# Patient Record
Sex: Male | Born: 1955 | Race: Black or African American | Hispanic: No | Marital: Single | Smoking: Never smoker
Health system: Southern US, Community
[De-identification: ages and names within clinical notes are randomized; demographics above are authoritative.]

## PROBLEM LIST (undated history)

## (undated) DIAGNOSIS — E119 Type 2 diabetes mellitus without complications: Secondary | ICD-10-CM

## (undated) DIAGNOSIS — I10 Essential (primary) hypertension: Secondary | ICD-10-CM

## (undated) DIAGNOSIS — H9190 Unspecified hearing loss, unspecified ear: Secondary | ICD-10-CM

## (undated) HISTORY — PX: JOINT REPLACEMENT: SHX530

---

## 2013-06-23 ENCOUNTER — Emergency Department (HOSPITAL_COMMUNITY): Payer: Medicare PPO

## 2013-06-23 ENCOUNTER — Encounter (HOSPITAL_COMMUNITY): Payer: Self-pay | Admitting: Emergency Medicine

## 2013-06-23 ENCOUNTER — Emergency Department (HOSPITAL_COMMUNITY)
Admission: EM | Admit: 2013-06-23 | Discharge: 2013-06-23 | Disposition: A | Discharge status: Home / Self Care | Payer: Medicare PPO | Attending: Emergency Medicine | Admitting: Emergency Medicine

## 2013-06-23 DIAGNOSIS — I10 Essential (primary) hypertension: Secondary | ICD-10-CM | POA: Insufficient documentation

## 2013-06-23 DIAGNOSIS — R1012 Left upper quadrant pain: Secondary | ICD-10-CM | POA: Insufficient documentation

## 2013-06-23 DIAGNOSIS — R1013 Epigastric pain: Secondary | ICD-10-CM | POA: Insufficient documentation

## 2013-06-23 DIAGNOSIS — Z794 Long term (current) use of insulin: Secondary | ICD-10-CM | POA: Insufficient documentation

## 2013-06-23 DIAGNOSIS — Z7982 Long term (current) use of aspirin: Secondary | ICD-10-CM | POA: Insufficient documentation

## 2013-06-23 DIAGNOSIS — H9190 Unspecified hearing loss, unspecified ear: Secondary | ICD-10-CM | POA: Insufficient documentation

## 2013-06-23 DIAGNOSIS — E119 Type 2 diabetes mellitus without complications: Secondary | ICD-10-CM | POA: Insufficient documentation

## 2013-06-23 DIAGNOSIS — R109 Unspecified abdominal pain: Secondary | ICD-10-CM

## 2013-06-23 DIAGNOSIS — Z79899 Other long term (current) drug therapy: Secondary | ICD-10-CM | POA: Insufficient documentation

## 2013-06-23 DIAGNOSIS — IMO0002 Reserved for concepts with insufficient information to code with codable children: Secondary | ICD-10-CM | POA: Insufficient documentation

## 2013-06-23 HISTORY — DX: Unspecified hearing loss, unspecified ear: H91.90

## 2013-06-23 HISTORY — DX: Essential (primary) hypertension: I10

## 2013-06-23 HISTORY — DX: Type 2 diabetes mellitus without complications: E11.9

## 2013-06-23 LAB — COMPREHENSIVE METABOLIC PANEL
ALBUMIN: 3.7 g/dL (ref 3.5–5.2)
ALT: 26 U/L (ref 0–53)
AST: 22 U/L (ref 0–37)
Alkaline Phosphatase: 64 U/L (ref 39–117)
BUN: 18 mg/dL (ref 6–23)
CALCIUM: 9.4 mg/dL (ref 8.4–10.5)
CO2: 29 mEq/L (ref 19–32)
CREATININE: 1.16 mg/dL (ref 0.50–1.35)
Chloride: 103 mEq/L (ref 96–112)
GFR calc Af Amer: 79 mL/min — ABNORMAL LOW (ref >90)
GFR calc non Af Amer: 68 mL/min — ABNORMAL LOW (ref >90)
Glucose, Bld: 72 mg/dL (ref 70–99)
Potassium: 4.3 mEq/L (ref 3.7–5.3)
Sodium: 142 mEq/L (ref 137–147)
Total Bilirubin: 0.4 mg/dL (ref 0.3–1.2)
Total Protein: 7.6 g/dL (ref 6.0–8.3)

## 2013-06-23 LAB — CBC WITH DIFFERENTIAL/PLATELET
BASOS ABS: 0 10*3/uL (ref 0.0–0.1)
Basophils Relative: 0 % (ref 0–1)
EOS ABS: 0.2 10*3/uL (ref 0.0–0.7)
EOS PCT: 3 % (ref 0–5)
HCT: 42.3 % (ref 39.0–52.0)
Hemoglobin: 13.7 g/dL (ref 13.0–17.0)
LYMPHS PCT: 31 % (ref 12–46)
Lymphs Abs: 1.8 10*3/uL (ref 0.7–4.0)
MCH: 29 pg (ref 26.0–34.0)
MCHC: 32.4 g/dL (ref 30.0–36.0)
MCV: 89.4 fL (ref 78.0–100.0)
Monocytes Absolute: 0.7 10*3/uL (ref 0.1–1.0)
Monocytes Relative: 11 % (ref 3–12)
Neutro Abs: 3.2 10*3/uL (ref 1.7–7.7)
Neutrophils Relative %: 55 % (ref 43–77)
PLATELETS: 327 10*3/uL (ref 150–400)
RBC: 4.73 MIL/uL (ref 4.22–5.81)
RDW: 13.3 % (ref 11.5–15.5)
WBC: 5.8 10*3/uL (ref 4.0–10.5)

## 2013-06-23 LAB — URINALYSIS, ROUTINE W REFLEX MICROSCOPIC
BILIRUBIN URINE: NEGATIVE
Glucose, UA: NEGATIVE mg/dL
KETONES UR: NEGATIVE mg/dL
LEUKOCYTES UA: NEGATIVE
NITRITE: NEGATIVE
Protein, ur: NEGATIVE mg/dL
Specific Gravity, Urine: 1.02 (ref 1.005–1.030)
Urobilinogen, UA: 0.2 mg/dL (ref 0.0–1.0)
pH: 7.5 (ref 5.0–8.0)

## 2013-06-23 LAB — URINE MICROSCOPIC-ADD ON

## 2013-06-23 LAB — LIPASE, BLOOD: Lipase: 27 U/L (ref 11–59)

## 2013-06-23 LAB — LACTIC ACID, PLASMA: LACTIC ACID, VENOUS: 1.5 mmol/L (ref 0.5–2.2)

## 2013-06-23 LAB — TROPONIN I

## 2013-06-23 MED ORDER — ONDANSETRON HCL 4 MG/2ML IJ SOLN
4.0000 mg | INTRAMUSCULAR | Status: DC | PRN
Start: 2013-06-23 — End: 2013-06-23
  Administered 2013-06-23: 4 mg via INTRAVENOUS
  Filled 2013-06-23: qty 2

## 2013-06-23 MED ORDER — IOHEXOL 300 MG/ML  SOLN
100.0000 mL | Freq: Once | INTRAMUSCULAR | Status: AC | PRN
  Administered 2013-06-23: 100 mL via INTRAVENOUS

## 2013-06-23 MED ORDER — IOHEXOL 300 MG/ML  SOLN
50.0000 mL | Freq: Once | INTRAMUSCULAR | Status: AC | PRN
  Administered 2013-06-23: 50 mL via ORAL

## 2013-06-23 MED ORDER — HYDROCODONE-ACETAMINOPHEN 5-325 MG PO TABS: ORAL_TABLET | ORAL | Status: AC

## 2013-06-23 MED ORDER — SODIUM CHLORIDE 0.9 % IV SOLN
INTRAVENOUS | Status: DC
  Administered 2013-06-23: 13:00:00 via INTRAVENOUS

## 2013-06-23 MED ORDER — MORPHINE SULFATE 4 MG/ML IJ SOLN
4.0000 mg | INTRAMUSCULAR | Status: DC | PRN
  Administered 2013-06-23: 4 mg via INTRAVENOUS
  Filled 2013-06-23: qty 1

## 2013-06-23 MED ORDER — FAMOTIDINE IN NACL 20-0.9 MG/50ML-% IV SOLN
20.0000 mg | Freq: Once | INTRAVENOUS | Status: AC
  Administered 2013-06-23: 20 mg via INTRAVENOUS
  Filled 2013-06-23: qty 50

## 2013-06-23 NOTE — Discharge Instructions (Signed)
°Emergency Department Resource Guide °1) Find a Doctor and Pay Out of Pocket °Although you won't have to find out who is covered by your insurance plan, it is a good idea to ask around and get recommendations. You will then need to call the office and see if the doctor you have chosen will accept you as a new patient and what types of options they offer for patients who are self-pay. Some doctors offer discounts or will set up payment plans for their patients who do not have insurance, but you will need to ask so you aren't surprised when you get to your appointment. ° °2) Contact Your Local Health Department °Not all health departments have doctors that can see patients for sick visits, but many do, so it is worth a call to see if yours does. If you don't know where your local health department is, you can check in your phone book. The CDC also has a tool to help you locate your state's health department, and many state websites also have listings of all of their local health departments. ° °3) Find a Walk-in Clinic °If your illness is not likely to be very severe or complicated, you may want to try a walk in clinic. These are popping up all over the country in pharmacies, drugstores, and shopping centers. They're usually staffed by nurse practitioners or physician assistants that have been trained to treat common illnesses and complaints. They're usually fairly quick and inexpensive. However, if you have serious medical issues or chronic medical problems, these are probably not your best option. ° °No Primary Care Doctor: °- Call [facility] at  832-8000 - they can help you locate a primary care doctor that  accepts your insurance, provides certain services, etc. °- Physician Referral Service- 1-800-533-3463 ° °Chronic Pain Problems: °Organization         Address  Phone   Notes  °[facility]  (336) 297-2271 Patients need to be referred by their primary care doctor.  ° °Medication  Assistance: °Organization         Address  Phone   Notes  °[place] [facility] [address] °[place], [place] [address] (336) 641-8030 --Must be a resident of [place] °-- Must have NO insurance coverage whatsoever (no Medicaid/ Medicare, etc.) °-- The pt. MUST have a primary care doctor that directs their care regularly and follows them in the community °  °[facility]  (866) 331-1348   °[employer]  (888) 892-1162   ° °Agencies that provide inexpensive medical care: °Organization         Address  Phone   Notes  °[facility]  (336) 832-8035   °[facility]    (336) 832-7272   °[facility] [address] °[place], [place] [address] (336) 832-4777   °[facility] [address], °[place] (336) 271-4999   °[facility]    (336) 373-0678   °[facility]    (336) 272-1050   °[facility] ° [address], [place] Phone:  (336) 832-4444, Fax:  (336) 832-4440 Hours of Operation:  9 am - 6 pm, M-F.  Also accepts Medicaid/Medicare and self-pay.  °[facility] ° [address], [place] Phone: (336) 832-3150, Fax: (336) 832-3151. Hours of Operation:  8:30 am - 5:30 pm, M-F.  Also accepts Medicaid and self-pay.  °[facility] [place] [address], [place] Phone: (336) 878-6027   °[facility] [address], [place], [place] (336)723-1848, Ext. 123 Mondays & Thursdays: 7-9 AM.  First 15 patients are seen on a first come, first serve basis. °  ° °Medicaid-accepting [place] Providers: ° °Organization         Address  Phone   Notes  °[facility] [address], [place] (336) 641-2100 Also accepts self-pay patients.  °[facility] [address], [place] ° (336) 856-9996   °[facility] [address], [place]  (336) 288-8857   °[facility] [address] [place] [address], [place] (336) 299-7000   °Veita Bland [address], [place]  ° (336) 373-1557 Only accepts [place] Access Medicaid patients after they have their name applied to their card.  ° °Self-Pay (no insurance) in [place]: ° °Organization         Address  Phone   Notes  °Sickle Cell Patients, [facility] [address], [place] (336) 832-1970   °[facility] [address], [place] (336) 832-4400   °[facility] ° [address], [place] (336) 992-4800   °[facility]/Dr. Osei-Bonsu ° [address] [place] [address], [place] or [address], [place] (336) 841-8500 Phone number for both [place] and [place] locations is the same.  °[facility] [address], [place] (336) 299-0000   °[facility] [address] [place] [address], [place] or [address] (336) 852-7530 °(336) 878-2260   °[facility] [address], [place] (336) 350-1642, phone; (336) 294-5005, fax Sees patients 1st and 3rd Saturday of every month.  Must not qualify for public or private insurance (i.e. Medicaid, Medicare, [place] Health Choice, Veterans' Benefits) • Household income should be no more than 200% of the poverty level •The clinic cannot treat you if you are pregnant or think you are pregnant • Sexually transmitted diseases are not treated at the clinic.  ° ° °Dental Care: °Organization         Address  Phone  Notes  °[place] [facility] [address], [place] (336) 641-6152 Accepts children up to age 21 who are enrolled in Medicaid or [place] Health Choice; pregnant women with a Medicaid card; and children who have applied for Medicaid or [place] Health Choice, but were declined, whose parents can pay a reduced fee at time of service.  °[place]  [facility] [place]  [address], [place] (336) 641-7733 Accepts children up to age 21 who are enrolled in Medicaid or [place] Health Choice; pregnant women with a Medicaid card; and children who have applied for Medicaid or [place] Health Choice, but were declined, whose parents can pay a reduced fee at time of service.  °[facility] ° [address], [place] (336) 641-4533 Patients are seen by appointment only. Walk-ins are not accepted. [facility] will see patients 18 years of age and older. °Monday - Tuesday (8am-5pm) °Most Wednesdays (8:30-5pm) °$30 per visit, cash only  °[facility] ° [address], [place] (336) 641-4533 Patients are seen by appointment only. Walk-ins are not accepted. [facility] will see patients 18 years of age and older. °One   Wednesday Evening (Monthly: Volunteer Based).  $30 per visit, cash only  °[facility]  (919) 537-3737 for adults; Children under age 4, call Graduate Pediatric Dentistry at ([phone]) [phone]. Children aged 4-14, please call ([phone]) [phone] to request a pediatric application. ° Dental services are provided in all areas of dental care including fillings, crowns and bridges, complete and partial dentures, implants, gum treatment, root canals, and extractions. Preventive care is also provided. Treatment is provided to both adults and children. °Patients are selected via a lottery and there is often a waiting list. °  °[facility] [address], °[place] ° (336) 763-8833 [email or web address] °  °[facility] [address], [place], [place] (336)723-1848, Ext. 123 Second and Fourth Thursday of each month, opens at 6:30 AM; Clinic ends at 9 AM.  Patients are seen on a first-come first-served basis, and a limited number are seen during each clinic.  ° °[facility] ° [address], [place], [place] (336) 723-7904    Eligibility Requirements °You must have lived in [place], [place], or [place] counties for at least the last three months. °  You cannot be eligible for state or federal sponsored healthcare insurance, including Veterans Administration, Medicaid, or Medicare. °  You generally cannot be eligible for healthcare insurance through your employer.  °  How to apply: °Eligibility screenings are held every Tuesday and Wednesday afternoon from 1:00 pm until 4:00 pm. You do not need an appointment for the interview!  °[facility] [address], [place], [place] 336-631-2330   °[facility]  336-342-8273   °[facility]  336-703-3100   °[facility]  336-570-6415   ° °Behavioral Health Resources in the Community: °Intensive Outpatient Programs °Organization         Address  Phone  Notes  °[facility] [address], [place], [place] 336-878-6098   °[facility] [address], [place], [place] 336-832-9800   °[facility] [address], [place], [place] ° 336-882-2125   °[facility] [address],  °[place], [place] 1-800-853-5163 or 336-641-4981   °Substance Abuse Resources °Organization         Address  Phone  Notes  °[facility]  336-882-2125   °[facility]  336-784-9470   °[facility]  336-285-9073   °[facility]  336-845-3988   °Residential & Outpatient Substance Abuse Program  1-800-659-3381   °Psychological Services °Organization         Address  Phone  Notes  °[facility]  336- 832-9600   °[facility]  336- 378-7881   °[facility] [address], [place] 1-800-853-5163 or 336-641-4981   ° °Mobile Crisis Teams °Organization         Address  Phone  Notes  °[facility], Mobile Crisis Care Unit  1-877-626-1772   °Assertive °[facility] ° [address]  [place], [place] 336-834-9664   °Sharon DeEsch [address] °[place] 336-554-5454   ° °Self-Help/Support Groups °Organization         Address  Phone             Notes  °[facility]  - variety of support groups  336- 373-1402 Call for more information  °Narcotics Anonymous (NA), [facility] [address], °[place]  2 meetings at this location  ° °  Residential Treatment Programs Organization         Address  Phone  Notes  [facility] [address],    [place]  [phone]   [facility]  [address], [place] [phone], [place], [place] [phone]   [facility] [address], [place] [phone] Admissions: 8am-3pm M-F  [facility] [address]    [place], [place] 295-284-1324([phone])[phone]   [facility] [address], [place], [place] [phone]   [facility] [address]  [place], [place] [phone])[phone]   [facility] Intensive Outpatient [address], [place], [place] [phone]   [facility] [address]  [place], [place] [phone]-(214)[phone] or [phone]   [facility] [address] [place], [place] [phone] Accepts Medicaid  [facility] [address]  [place] [phone] Substance Abuse/Addiction Treatment   [facility] Organization         Address  Phone  Notes  [facility]  [phone](888) (267)[phone]   Angie FavaJulie Brannon, PhD [address], [address] [place], [place]   [phone](336) [phone] or [phone](336) [phone]   [facility]   [address] [place], [place] [phone]) [phone]   [facility] [address], [place], [place] [phone](336) ([phone])[phone] Insurance/Medicaid/sponsorship through [facility] [address]                                    [place], [place] [phone](336) ([phone] Therapy/tele-psych/case    [facility] [address].   [place], [place] [phone](336) [phone]    Dr. Lolly MustacheArfeen  [phone]) [phone]   [facility]  [employer] [facility] 1) [address], [place] 2) [address], [place] 3)  [address], [place] [phone](336) [phone](336) ([phone]) [phone]  [phone](336) ([phone]   [facility] Child [facility] Hot[facility] 616-254-9675(336) (952) 130-7496 or 223-830-2300(336) [phone] (After Hours)       Eat a bland diet, avoiding greasy, fatty, fried foods, as well as spicy and acidic foods or beverages.  Avoid eating within the hour or 2 before going to bed or laying down.  Also avoid teas, colas, coffee, chocolate, pepermint and spearment.  Take over the counter pepcid, one tablet by mouth twice a day, for the next 2 to 3 weeks.  May also take over the counter maalox/mylanta, as directed on packaging, as needed for discomfort.  Take your usual prescriptions as previously directed.  Call your regular medical doctor today to schedule a follow up appointment in the next 2 days. Call the GI doctor today to schedule a follow up appointment within the next week.  Return to the Emergency Department immediately if worsening.

## 2013-06-23 NOTE — ED Notes (Signed)
Abdominal pain times 2 days.  Denies any fever,  Vomiting or diarrhea.

## 2013-06-23 NOTE — ED Provider Notes (Signed)
CSN: [id number]     Arrival date & time 06/23/13  1145 History   First MD Initiated Contact with Patient 06/23/13 1221     Chief Complaint  Patient presents with  . Abdominal Pain     HPI Pt was seen at 1225.  Per pt, c/o gradual onset and persistence of constant abd "pain" for the past 2 days. Describes the abd pain as "sore." Has not taken any meds for same. Denies hx of same. Denies N/V/D, no fevers, no back pain, no rash, no CP/SOB, no black or blood in stools.       Past Medical History  Diagnosis Date  . Diabetes mellitus without complication   . Hypertension   . Hearing deficit    Past Surgical History  Procedure Laterality Date  . Joint replacement      History  Substance Use Topics  . Smoking status: Never Smoker   . Smokeless tobacco: Not on file  . Alcohol Use: No    Review of Systems ROS: Statement: All systems negative except as marked or noted in the HPI; Constitutional: Negative for fever and chills. ; ; Eyes: Negative for eye pain, redness and discharge. ; ; ENMT: Negative for ear pain, hoarseness, nasal congestion, sinus pressure and sore throat. ; ; Cardiovascular: Negative for chest pain, palpitations, diaphoresis, dyspnea and peripheral edema. ; ; Respiratory: Negative for cough, wheezing and stridor. ; ; Gastrointestinal: +abd pain. Negative for nausea, vomiting, diarrhea, blood in stool, hematemesis, jaundice and rectal bleeding. . ; ; Genitourinary: Negative for dysuria, flank pain and hematuria. ; ; Musculoskeletal: Negative for back pain and neck pain. Negative for swelling and trauma.; ; Skin: Negative for pruritus, rash, abrasions, blisters, bruising and skin lesion.; ; Neuro: Negative for headache, lightheadedness and neck stiffness. Negative for weakness, altered level of consciousness , altered mental status, extremity weakness, paresthesias, involuntary movement, seizure and syncope.      Allergies  Review of patient's allergies indicates no known  allergies.  Home Medications   Current Outpatient Rx  Name  Route  Sig  Dispense  Refill  . albuterol (PROVENTIL HFA;VENTOLIN HFA) 108 (90 BASE) MCG/ACT inhaler   Inhalation   Inhale 2 puffs into the lungs 4 (four) times daily.         Marland Kitchen amLODipine (NORVASC) 10 MG tablet   Oral   Take 10 mg by mouth daily.         Marland Kitchen aspirin EC 81 MG tablet   Oral   Take 81 mg by mouth daily.         Marland Kitchen atorvastatin (LIPITOR) 40 MG tablet   Oral   Take 40 mg by mouth at bedtime.         . Cholecalciferol (VITAMIN D) 2000 UNITS CAPS   Oral   Take 1 capsule by mouth 2 (two) times a week.         . Fluticasone-Salmeterol (ADVAIR) 250-50 MCG/DOSE AEPB   Inhalation   Inhale 1 puff into the lungs every 12 (twelve) hours.         . insulin detemir (LEVEMIR) 100 UNIT/ML injection   Subcutaneous   Inject 40 Units into the skin every morning.         . labetalol (NORMODYNE) 200 MG tablet   Oral   Take 200 mg by mouth 2 (two) times daily.         Marland Kitchen latanoprost (XALATAN) 0.005 % ophthalmic solution   Both Eyes   Place 1 drop  into both eyes at bedtime.         Marland Kitchen. loratadine (CLARITIN) 10 MG tablet   Oral   Take 10 mg by mouth daily.         Marland Kitchen. losartan-hydrochlorothiazide (HYZAAR) 100-25 MG per tablet   Oral   Take 1 tablet by mouth daily.         . metFORMIN (GLUCOPHAGE) 500 MG tablet   Oral   Take 500 mg by mouth 2 (two) times daily with a meal.          BP 160/87  Pulse 73  Temp(Src) 98.2 F (36.8 C) (Oral)  Resp 20  Ht 5\' 7"  (1.702 m)  Wt 250 lb (113.399 kg)  BMI 39.15 kg/m2  SpO2 96% Physical Exam 1230: Physical examination:  Nursing notes reviewed; Vital signs and O2 SAT reviewed;  Constitutional: Well developed, Well nourished, Well hydrated, In no acute distress; Head:  Normocephalic, atraumatic; Eyes: EOMI, PERRL, No scleral icterus; ENMT: Mouth and pharynx normal, Mucous membranes moist; Neck: Supple, Full range of motion, No lymphadenopathy;  Cardiovascular: Regular rate and rhythm, No gallop; Respiratory: Breath sounds clear & equal bilaterally, No rales, rhonchi, wheezes.  Speaking full sentences with ease, Normal respiratory effort/excursion; Chest: Nontender, Movement normal; Abdomen: Soft, +mild diffuse tenderness to palp, esp LUQ and mid-epigastric areas. No rebound or guarding. Nondistended, Normal bowel sounds; Genitourinary: No CVA tenderness; Extremities: Pulses normal, No tenderness, No edema, No calf edema or asymmetry.; Neuro: AA&Ox3, HOH per baseline, otherwise major CN grossly intact.  Speech clear. No gross focal motor or sensory deficits in extremities. Climbs on and off stretcher easily by himself. Gait steady.; Skin: Color normal, Warm, Dry.   ED Course  Procedures   EKG Interpretation    Date/Time:  Wednesday June 23 2013 12:46:21 EST Ventricular Rate:  68 PR Interval:  178 QRS Duration: 76 QT Interval:  394 QTC Calculation: 418 R Axis:   7 Text Interpretation:  Normal sinus rhythm Baseline wander Anteroseptal infarct , age undetermined Nonspecific T wave abnormality Abnormal ECG No previous ECGs available Confirmed by [facility]  MD, Nicholos JohnsKATHLEEN [phone]) on 06/23/2013 2:05:00 PM            MDM  MDM Reviewed: previous chart, nursing note and vitals Reviewed previous: labs Interpretation: labs, x-ray, CT scan and ECG    Results for orders placed during the hospital encounter of 06/23/13  URINALYSIS, ROUTINE W REFLEX MICROSCOPIC      Result Value Ref Range   Color, Urine YELLOW  YELLOW   APPearance CLEAR  CLEAR   Specific Gravity, Urine 1.020  1.005 - 1.030   pH 7.5  5.0 - 8.0   Glucose, UA NEGATIVE  NEGATIVE mg/dL   Hgb urine dipstick SMALL (*) NEGATIVE   Bilirubin Urine NEGATIVE  NEGATIVE   Ketones, ur NEGATIVE  NEGATIVE mg/dL   Protein, ur NEGATIVE  NEGATIVE mg/dL   Urobilinogen, UA 0.2  0.0 - 1.0 mg/dL   Nitrite NEGATIVE  NEGATIVE   Leukocytes, UA NEGATIVE  NEGATIVE  URINE MICROSCOPIC-ADD  ON      Result Value Ref Range   Squamous Epithelial / LPF FEW (*) RARE   WBC, UA 3-6  <3 WBC/hpf   RBC / HPF 3-6  <3 RBC/hpf   Bacteria, UA FEW (*) RARE  CBC WITH DIFFERENTIAL      Result Value Ref Range   WBC 5.8  4.0 - 10.5 K/uL   RBC 4.73  4.22 - 5.81 MIL/uL   Hemoglobin 13.7  13.0 - 17.0 g/dL   HCT 78.4  69.6 - 29.5 %   MCV 89.4  78.0 - 100.0 fL   MCH 29.0  26.0 - 34.0 pg   MCHC 32.4  30.0 - 36.0 g/dL   RDW 28.4  13.2 - 44.0 %   Platelets 327  150 - 400 K/uL   Neutrophils Relative % 55  43 - 77 %   Neutro Abs 3.2  1.7 - 7.7 K/uL   Lymphocytes Relative 31  12 - 46 %   Lymphs Abs 1.8  0.7 - 4.0 K/uL   Monocytes Relative 11  3 - 12 %   Monocytes Absolute 0.7  0.1 - 1.0 K/uL   Eosinophils Relative 3  0 - 5 %   Eosinophils Absolute 0.2  0.0 - 0.7 K/uL   Basophils Relative 0  0 - 1 %   Basophils Absolute 0.0  0.0 - 0.1 K/uL  COMPREHENSIVE METABOLIC PANEL      Result Value Ref Range   Sodium 142  137 - 147 mEq/L   Potassium 4.3  3.7 - 5.3 mEq/L   Chloride 103  96 - 112 mEq/L   CO2 29  19 - 32 mEq/L   Glucose, Bld 72  70 - 99 mg/dL   BUN 18  6 - 23 mg/dL   Creatinine, Ser 1.02  0.50 - 1.35 mg/dL   Calcium 9.4  8.4 - 72.5 mg/dL   Total Protein 7.6  6.0 - 8.3 g/dL   Albumin 3.7  3.5 - 5.2 g/dL   AST 22  0 - 37 U/L   ALT 26  0 - 53 U/L   Alkaline Phosphatase 64  39 - 117 U/L   Total Bilirubin 0.4  0.3 - 1.2 mg/dL   GFR calc non Af Amer 68 (*) >90 mL/min   GFR calc Af Amer 79 (*) >90 mL/min  LIPASE, BLOOD      Result Value Ref Range   Lipase 27  11 - 59 U/L  LACTIC ACID, PLASMA      Result Value Ref Range   Lactic Acid, Venous 1.5  0.5 - 2.2 mmol/L  TROPONIN I      Result Value Ref Range   Troponin I <0.30  <0.30 ng/mL   Dg Chest 2 View 06/23/2013   CLINICAL DATA:  Abdominal pain.  EXAM: CHEST  2 VIEW  COMPARISON:  None.  FINDINGS: Cardiomegaly. Upper mediastinal contours are distorted by the hypoaeration. The trachea is displaced to the right, likely tracheal buckling  from low volume lungs. No edema or consolidation. No effusion or pneumothorax.  IMPRESSION: Low volume lungs.  No edema or consolidation.   Electronically Signed   By: Tiburcio Pea M.D.   On: 06/23/2013 14:50   Ct Abdomen Pelvis W Contrast 06/23/2013   CLINICAL DATA:  Abdomen pain  EXAM: CT ABDOMEN AND PELVIS WITH CONTRAST  TECHNIQUE: Multidetector CT imaging of the abdomen and pelvis was performed using the standard protocol following bolus administration of intravenous contrast.  CONTRAST:  OMNIPAQUE IOHEXOL 300 MG/ML SOLN, 50mL OMNIPAQUE IOHEXOL 300 MG/ML SOLN  COMPARISON:  None.  FINDINGS: There is diffuse fatty infiltration of liver. The spleen, pancreas, gallbladder are normal. There are small low-density nodules in superior aspect bilateral adrenal glands, nonspecific on this postcontrast exam but statistically most likely adrenal adenoma. There is a 9 mm cyst in the upper pole right kidney. The kidneys are otherwise normal. There is no hydronephrosis bilaterally. The aorta is normal.  There is no abdominal lymphadenopathy. There is no small bowel obstruction or diverticulitis. The appendix is not seen but no inflammation is noted around cecum.  Partial fluid-filled bladder is normal. Prostate calcifications are identified. Minimal dependent atelectasis of the lung bases are noted. Degenerative joint changes of the spine are noted. No acute abnormalities identified within the visualized bones.  IMPRESSION: No acute abnormality identified in the abdomen and pelvis. Diffuse fatty infiltration of liver. Right kidney cyst.   Electronically Signed   By: Sherian Rein M.D.   On: 06/23/2013 14:20     1520:   Pt states he feels better after meds and wants to go home now. Pt has tol PO well without N/V. No stooling while in the ED. Abd soft/NT, VSS.  EKG with NS STTW changes, no old to compare. Doubt ACS as cause for symptoms today with normal troponin after 2 days of constant atypical symptoms, as well  as pt repetitively denying any episodes of CP/SOB today or at any time in the past several days. Dx and testing d/w pt and family.  Questions answered.  Verb understanding, agreeable to d/c home with outpt f/u.   Laray Anger, DO 06/26/13 1240

## 2013-06-24 LAB — URINE CULTURE

## 2017-07-19 ENCOUNTER — Emergency Department (HOSPITAL_COMMUNITY): Payer: Medicare PPO

## 2017-07-19 ENCOUNTER — Other Ambulatory Visit: Payer: Self-pay

## 2017-07-19 ENCOUNTER — Encounter (HOSPITAL_COMMUNITY): Payer: Self-pay | Admitting: Emergency Medicine

## 2017-07-19 ENCOUNTER — Emergency Department (HOSPITAL_COMMUNITY)
Admission: EM | Admit: 2017-07-19 | Discharge: 2017-07-20 | Disposition: A | Discharge status: Home / Self Care | Payer: Medicare PPO | Attending: Emergency Medicine | Admitting: Emergency Medicine

## 2017-07-19 DIAGNOSIS — R05 Cough: Secondary | ICD-10-CM | POA: Insufficient documentation

## 2017-07-19 DIAGNOSIS — R109 Unspecified abdominal pain: Secondary | ICD-10-CM | POA: Diagnosis present

## 2017-07-19 DIAGNOSIS — Z79899 Other long term (current) drug therapy: Secondary | ICD-10-CM | POA: Diagnosis not present

## 2017-07-19 DIAGNOSIS — Z7982 Long term (current) use of aspirin: Secondary | ICD-10-CM | POA: Diagnosis not present

## 2017-07-19 DIAGNOSIS — I10 Essential (primary) hypertension: Secondary | ICD-10-CM | POA: Diagnosis not present

## 2017-07-19 DIAGNOSIS — E119 Type 2 diabetes mellitus without complications: Secondary | ICD-10-CM | POA: Diagnosis not present

## 2017-07-19 DIAGNOSIS — R067 Sneezing: Secondary | ICD-10-CM | POA: Diagnosis not present

## 2017-07-19 DIAGNOSIS — Z794 Long term (current) use of insulin: Secondary | ICD-10-CM | POA: Insufficient documentation

## 2017-07-19 MED ORDER — MORPHINE SULFATE (PF) 4 MG/ML IV SOLN
4.0000 mg | Freq: Once | INTRAVENOUS | Status: AC
  Administered 2017-07-20: 4 mg via INTRAVENOUS
  Filled 2017-07-19: qty 1

## 2017-07-19 NOTE — ED Triage Notes (Addendum)
Patient reports L sided pain that is tender to touch, states he has had the pain for a while. [facility] with [facility]. Patient denies urinary symptoms or constipation. [facility]. Patient states he continues to have pain.

## 2017-07-19 NOTE — ED Provider Notes (Signed)
[facility] EMERGENCY DEPARTMENT Provider Note   CSN: [id number] Arrival date & time: 07/19/17  2123     History   Chief Complaint Chief Complaint  Patient presents with  . Abdominal Pain    HPI Henry NailDonald Frost is a 62 y.o. male.  The history is provided by the patient.  Abdominal Pain    He has history of hypertension and diabetes and comes in with left lateral abdominal pain for about the last week.  There has been associated sneezing and coughing.  Pain is worse when he sneezes or coughs.  It is also worse when he touches it.  Pain is rated at 7/10.  There has been no nausea or vomiting.  However, when he eats, his abdomen starts to feel very bloated and tight.  He has not taken anything for the pain.  He was [facility] and diagnosed with gastritis, advised to use over-the-counter proton pump inhibitors.  Also noted to be constipated and given a dose of magnesium citrate.  Symptoms have not improved in spite of this treatment.  Past Medical History:  Diagnosis Date  . Diabetes mellitus without complication (HCC)   . Hearing deficit   . Hypertension     There are no active problems to display for this patient.   Past Surgical History:  Procedure Laterality Date  . JOINT REPLACEMENT          Home Medications    Prior to Admission medications   Medication Sig Start Date End Date Taking? Authorizing Provider  albuterol (PROVENTIL HFA;VENTOLIN HFA) 108 (90 BASE) MCG/ACT inhaler Inhale 2 puffs into the lungs 4 (four) times daily.    [provider]  amLODipine (NORVASC) 10 MG tablet Take 10 mg by mouth daily.    [provider]  aspirin EC 81 MG tablet Take 81 mg by mouth daily.    [provider]  atorvastatin (LIPITOR) 40 MG tablet Take 40 mg by mouth at bedtime.    [provider]  Cholecalciferol (VITAMIN D) 2000 UNITS CAPS Take 1 capsule by mouth 2 (two) times a week.    [provider]  Fluticasone-Salmeterol (ADVAIR) 250-50 MCG/DOSE AEPB Inhale 1 puff into the lungs every 12 (twelve) hours.    [provider]  HYDROcodone-acetaminophen (NORCO/VICODIN) 5-325 MG per tablet 1 or 2 tabs PO q6 hours prn pain 06/23/13   Samuel JesterMcManus, Kathleen, DO  insulin detemir (LEVEMIR) 100 UNIT/ML injection Inject 40 Units into the skin every morning.    [provider]  labetalol (NORMODYNE) 200 MG tablet Take 200 mg by mouth 2 (two) times daily.    [provider]  latanoprost (XALATAN) 0.005 % ophthalmic solution Place 1 drop into both eyes at bedtime.    [provider]  loratadine (CLARITIN) 10 MG tablet Take 10 mg by mouth daily.    [provider]  losartan-hydrochlorothiazide (HYZAAR) 100-25 MG per tablet Take 1 tablet by mouth daily.    [provider]  metFORMIN (GLUCOPHAGE) 500 MG tablet Take 500 mg by mouth 2 (two) times daily with a meal.    [provider]    Family History Family History  Problem Relation Age of Onset  . Stroke Mother   . Heart disease Brother   . Hypertension Other   . Diabetes Other   . Cancer Other     Social History Social History   Tobacco Use  . Smoking status: Never Smoker  .  Smokeless tobacco: Never Used  Substance Use Topics  . Alcohol use: No  . Drug use: No     Allergies   Patient has no known allergies.   Review of Systems Review of Systems  Gastrointestinal: Positive for abdominal pain.  All other systems reviewed and are negative.    Physical Exam Updated Vital Signs BP (!) 145/85   Pulse 82   Temp 98.2 F (36.8 C) (Oral)   Resp 20   Ht 5\' 7"  (1.702 m)   Wt 110.2 kg (243 lb)   SpO2 97%   BMI 38.06 kg/m   Physical Exam  Nursing note and vitals reviewed.  Obese 63 year old male, resting comfortably and in no acute distress. Vital signs are significant for elevated systolic blood pressure. Oxygen saturation is 97%, which is normal. Head is  normocephalic and atraumatic. PERRLA, EOMI. Oropharynx is clear. Neck is nontender and supple without adenopathy or JVD. Back is nontender and there is no CVA tenderness. Lungs are clear without rales, wheezes, or rhonchi. Chest is nontender. Heart has regular rate and rhythm without murmur. Abdomen is soft, flat, with moderate tenderness over the left lateral lower rib cage and left lateral upper abdomen.  There is no rebound or guarding.  There are no masses or hepatosplenomegaly and peristalsis is normoactive. Extremities have no cyanosis or edema, full range of motion is present. Skin is warm and dry without rash. Neurologic: Mental status is normal, cranial nerves are intact, there are no motor or sensory deficits.  ED Treatments / Results  Labs (all labs ordered are listed, but only abnormal results are displayed) Labs Reviewed  COMPREHENSIVE METABOLIC PANEL - Abnormal; Notable for the following components:      Result Value   Glucose, Bld 102 (*)    BUN 23 (*)    ALT 12 (*)    All other components within normal limits  URINALYSIS, ROUTINE W REFLEX MICROSCOPIC - Abnormal; Notable for the following components:   Hgb urine dipstick SMALL (*)    Squamous Epithelial / LPF 0-5 (*)    All other components within normal limits  CBC WITH DIFFERENTIAL/PLATELET  LIPASE, BLOOD    Radiology Dg Chest 2 View  Result Date: 07/20/2017 CLINICAL DATA:  Cough. EXAM: CHEST - 2 VIEW COMPARISON:  06/23/2013 FINDINGS: Mild cardiomegaly and tortuous thoracic aorta, similar to prior. No pulmonary edema. No focal airspace disease, pleural effusion or pneumothorax. Chronic change about the right shoulder. IMPRESSION: Mild cardiomegaly without congestive failure or acute abnormality. Electronically Signed   By: Rubye Oaks M.D.   On: 07/20/2017 01:57   Ct Abdomen Pelvis W Contrast  Result Date: 07/20/2017 CLINICAL DATA:  Abdominal pain EXAM: CT ABDOMEN AND PELVIS WITH CONTRAST TECHNIQUE:  Multidetector CT imaging of the abdomen and pelvis was performed using the standard protocol following bolus administration of intravenous contrast. CONTRAST:  ISOVUE-300 IOPAMIDOL (ISOVUE-300) INJECTION 61% COMPARISON:  CT abdomen pelvis 06/23/2013 FINDINGS: Lower chest: No basilar pulmonary nodules or pleural effusion. No apical pericardial effusion. Hepatobiliary: Normal hepatic contours and density. No intra- or extrahepatic biliary dilatation. Normal gallbladder. Pancreas: Normal parenchymal contours without ductal dilatation. No peripancreatic fluid collection. Spleen: Normal. Adrenals/Urinary Tract: --Adrenal glands: Normal. --Right kidney/ureter: No hydronephrosis, perinephric stranding or nephrolithiasis. No obstructing ureteral stones. --Left kidney/ureter: No hydronephrosis, perinephric stranding or nephrolithiasis. No obstructing ureteral stones. --Urinary bladder: Normal appearance for the degree of distention. Stomach/Bowel: --Stomach/Duodenum: No hiatal hernia or other gastric abnormality. Normal duodenal course. --Small bowel: No dilatation or  inflammation. --Colon: There is distal transverse, descending and sigmoid colonic diverticulosis without acute inflammation. --Appendix: Not visualized. No right lower quadrant inflammation or free fluid. Vascular/Lymphatic: Normal course and caliber of the major abdominal vessels. No abdominal or pelvic lymphadenopathy. Reproductive: There are calcifications within the normal sized prostate gland. Musculoskeletal. Multilevel degenerative disc disease and facet arthrosis. No bony spinal canal stenosis. Other: There is edema of the subcutaneous fat of the lateral left flank. No fluid collection. IMPRESSION: 1. Edema of the subcutaneous fat of the lateral left flank corresponds to reported area tenderness to palpation. 2. Distal transverse, descending and sigmoid colonic diverticulosis without acute inflammation. Electronically Signed   By: Deatra Robinson  M.D.   On: 07/20/2017 01:59    Procedures Procedures (including critical care time)  Medications Ordered in ED Medications  dexamethasone (DECADRON) injection 10 mg (has no administration in time range)  morphine 4 MG/ML injection 4 mg (4 mg Intravenous Given 07/20/17 0006)  iopamidol (ISOVUE-300) 61 % injection 100 mL (100 mLs Intravenous Contrast Given 07/20/17 0110)     Initial Impression / Assessment and Plan / ED Course  I have reviewed the triage vital signs and the nursing notes.  Pertinent labs & imaging results that were available during my care of the patient were reviewed by me and considered in my medical decision making (see chart for details).  Abdominal pain/chest pain of uncertain cause.  Exam is suspicious for possible rib fracture or intercostal muscle strain, but patient states that the pain preceded the coughing and sneezing.  Will attempt to get records from [facility], will check screening labs and obtain CT of abdomen and pelvis with contrast.  We will also check chest x-ray to look for possible pneumonia.  Old records are reviewed, and he does have a prior ED visit 4 years ago for abdominal pain.  At that time, CT of abdomen and pelvis was unremarkable.  2:29 AM Laboratory workup is unremarkable.  WBC is normal with normal differential.  CT scan shows some inflammatory changes in the soft tissues of the left flank and, and this correlates with his area of pain.  Cause is unclear.  No evidence of rib fracture, no evidence of hematoma.  He is given a dose of dexamethasone and is discharged with prescription for tramadol.  Follow-up with primary care provider.  Return precautions discussed.  Final Clinical Impressions(s) / ED Diagnoses   Final diagnoses:  Acute left flank pain    ED Discharge Orders        Ordered    traMADol (ULTRAM) 50 MG tablet  Every 6 hours PRN     07/20/17 0227       Dione Booze, MD 07/20/17 [phone]

## 2017-07-20 ENCOUNTER — Emergency Department (HOSPITAL_COMMUNITY): Payer: Medicare PPO

## 2017-07-20 DIAGNOSIS — R109 Unspecified abdominal pain: Secondary | ICD-10-CM | POA: Diagnosis not present

## 2017-07-20 LAB — COMPREHENSIVE METABOLIC PANEL
ALT: 12 U/L — AB (ref 17–63)
AST: 18 U/L (ref 15–41)
Albumin: 3.9 g/dL (ref 3.5–5.0)
Alkaline Phosphatase: 61 U/L (ref 38–126)
Anion gap: 12 (ref 5–15)
BUN: 23 mg/dL — ABNORMAL HIGH (ref 6–20)
CHLORIDE: 102 mmol/L (ref 101–111)
CO2: 27 mmol/L (ref 22–32)
CREATININE: 1.12 mg/dL (ref 0.61–1.24)
Calcium: 9.6 mg/dL (ref 8.9–10.3)
GFR calc Af Amer: >60 mL/min (ref >60)
Glucose, Bld: 102 mg/dL — ABNORMAL HIGH (ref 65–99)
POTASSIUM: 4.1 mmol/L (ref 3.5–5.1)
Sodium: 141 mmol/L (ref 135–145)
TOTAL PROTEIN: 8 g/dL (ref 6.5–8.1)
Total Bilirubin: 0.7 mg/dL (ref 0.3–1.2)

## 2017-07-20 LAB — CBC WITH DIFFERENTIAL/PLATELET
Basophils Absolute: 0 10*3/uL (ref 0.0–0.1)
Basophils Relative: 0 %
EOS PCT: 4 %
Eosinophils Absolute: 0.3 10*3/uL (ref 0.0–0.7)
HCT: 41.2 % (ref 39.0–52.0)
Hemoglobin: 13.1 g/dL (ref 13.0–17.0)
LYMPHS ABS: 2 10*3/uL (ref 0.7–4.0)
LYMPHS PCT: 28 %
MCH: 29.4 pg (ref 26.0–34.0)
MCHC: 31.8 g/dL (ref 30.0–36.0)
MCV: 92.4 fL (ref 78.0–100.0)
MONO ABS: 0.6 10*3/uL (ref 0.1–1.0)
Monocytes Relative: 9 %
Neutro Abs: 4.2 10*3/uL (ref 1.7–7.7)
Neutrophils Relative %: 59 %
PLATELETS: 275 10*3/uL (ref 150–400)
RBC: 4.46 MIL/uL (ref 4.22–5.81)
RDW: 13 % (ref 11.5–15.5)
WBC: 7.1 10*3/uL (ref 4.0–10.5)

## 2017-07-20 LAB — URINALYSIS, ROUTINE W REFLEX MICROSCOPIC
Bacteria, UA: NONE SEEN
Bilirubin Urine: NEGATIVE
Glucose, UA: NEGATIVE mg/dL
Ketones, ur: NEGATIVE mg/dL
Leukocytes, UA: NEGATIVE
NITRITE: NEGATIVE
PH: 7 (ref 5.0–8.0)
PROTEIN: NEGATIVE mg/dL
Specific Gravity, Urine: 1.015 (ref 1.005–1.030)

## 2017-07-20 LAB — LIPASE, BLOOD: Lipase: 25 U/L (ref 11–51)

## 2017-07-20 MED ORDER — TRAMADOL HCL 50 MG PO TABS: 50.0000 mg | ORAL_TABLET | Freq: Four times a day (QID) | ORAL | 0 refills | Status: AC | PRN

## 2017-07-20 MED ORDER — DEXAMETHASONE SODIUM PHOSPHATE 10 MG/ML IJ SOLN
10.0000 mg | Freq: Once | INTRAMUSCULAR | Status: AC
  Administered 2017-07-20: 10 mg via INTRAVENOUS
  Filled 2017-07-20: qty 1

## 2017-07-20 MED ORDER — IOPAMIDOL (ISOVUE-300) INJECTION 61%
100.0000 mL | Freq: Once | INTRAVENOUS | Status: AC | PRN
  Administered 2017-07-20: 100 mL via INTRAVENOUS

## 2017-07-20 NOTE — Discharge Instructions (Addendum)
The CT scan showed some inflammation in the soft tissues in the left flank area where you have been having your pain.  The cause for this inflammation is not clear, but it does not appear to be an infection.  Apply ice to that area.  Take acetaminophen and/or ibuprofen for less severe pain.  Return if symptoms are getting worse.  Otherwise, follow-up with your primary care provider.

## 2018-08-01 IMAGING — DX DG CHEST 2V
2 series · 2 of 2 positions shown · non-contrast
Comparison: 06/23/2013

CLINICAL DATA: Cough.

EXAM:
CHEST - 2 VIEW

[chest pa]
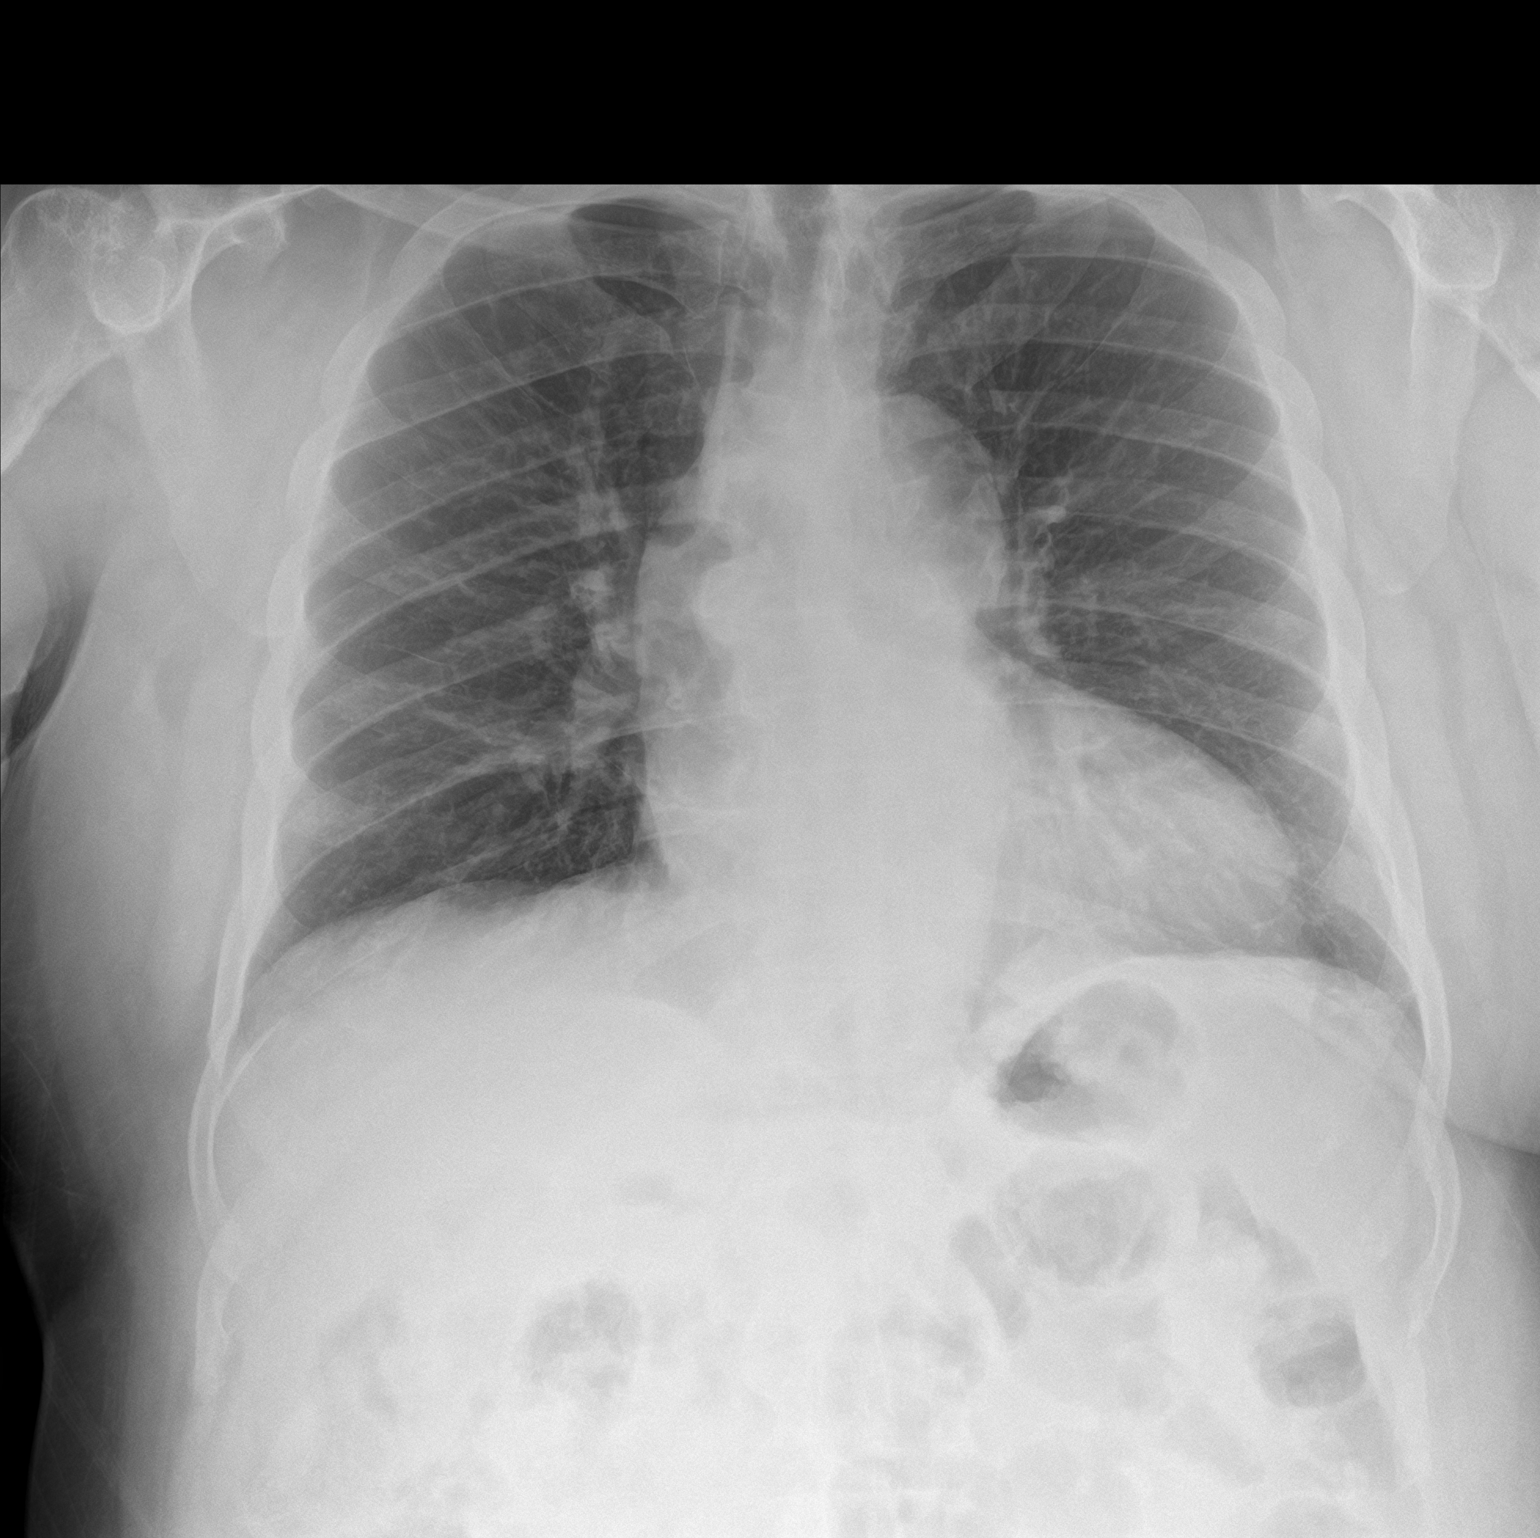

[chest lat]
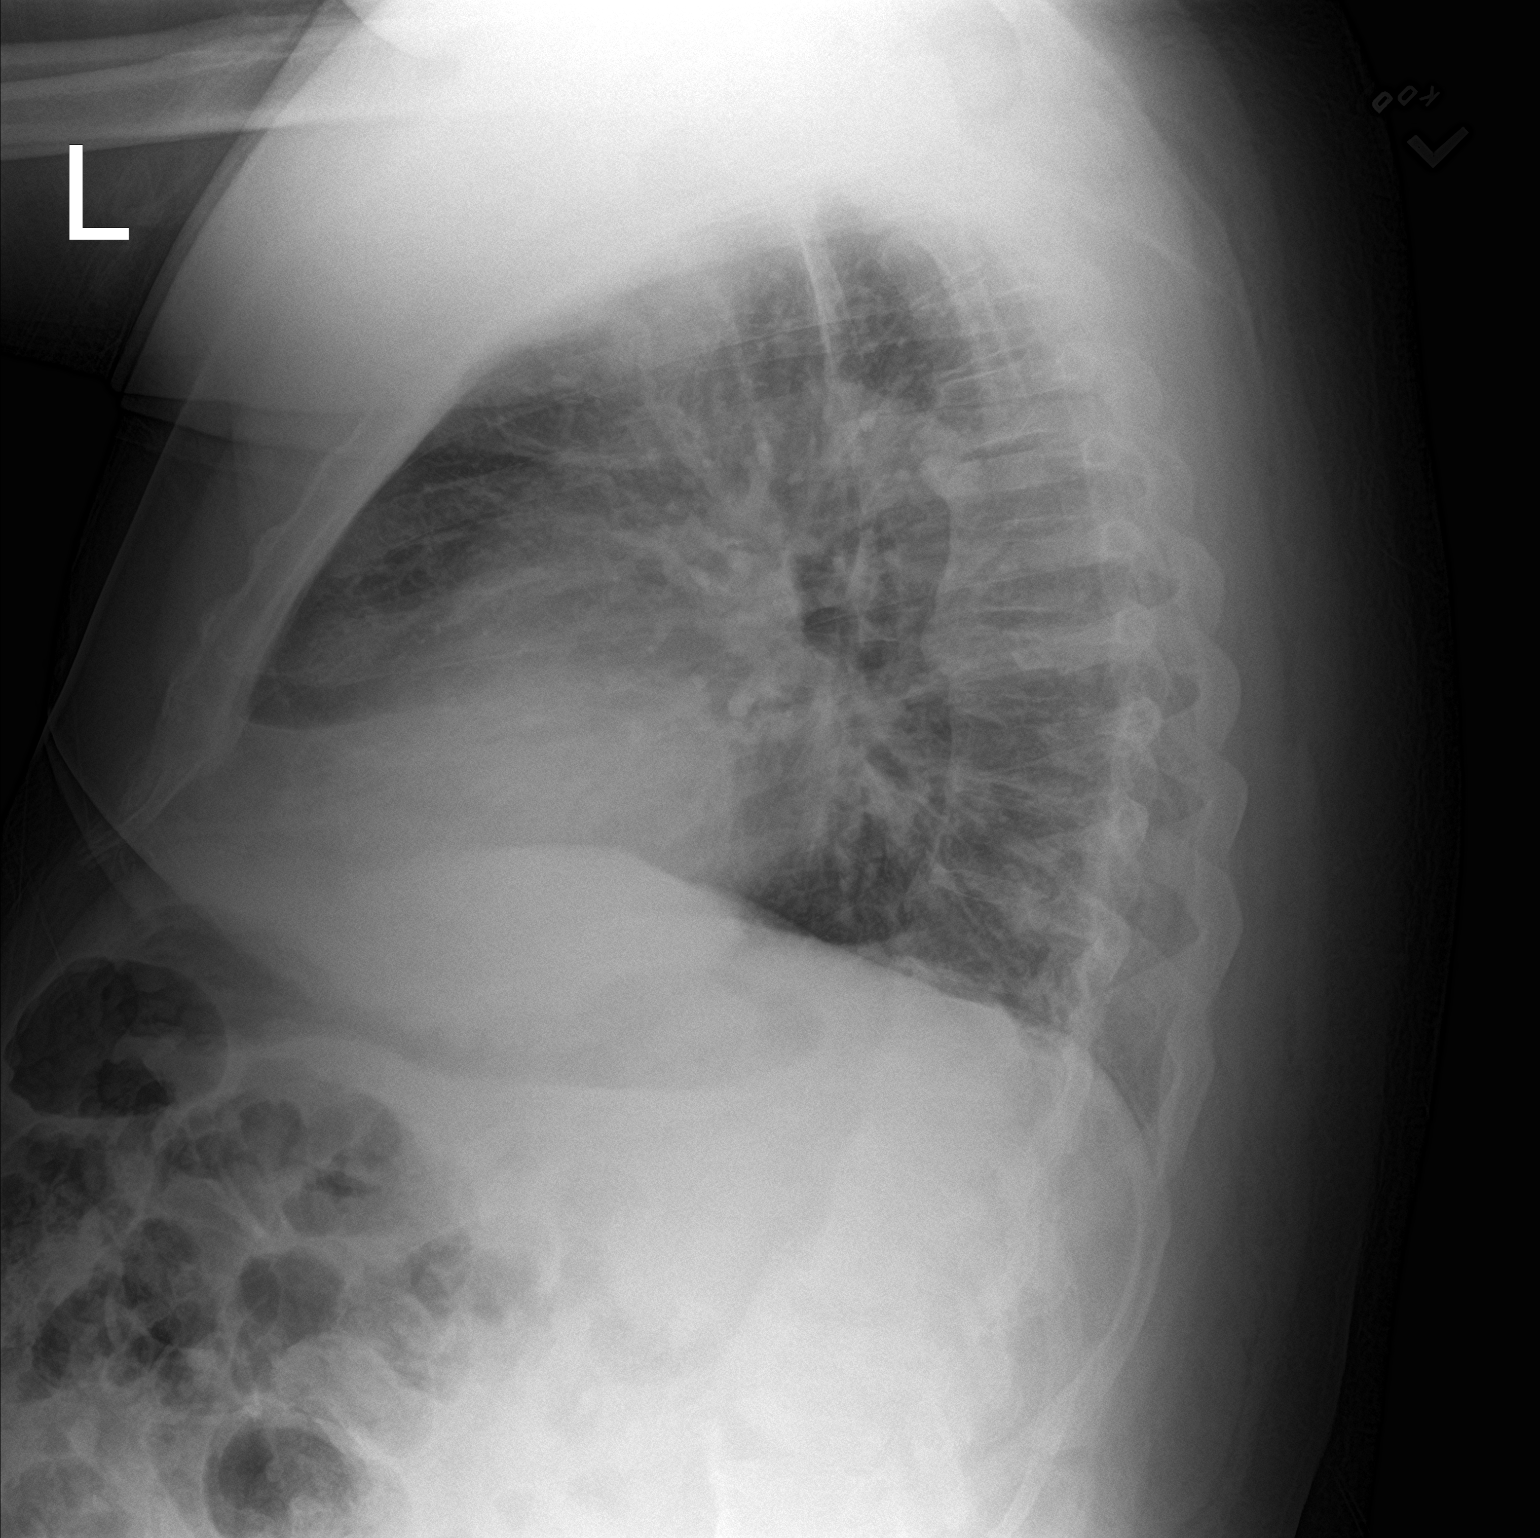

[2 of 2 positions shown; findings below may reference images not displayed]

FINDINGS: Mild cardiomegaly and tortuous thoracic aorta, similar to prior. No
pulmonary edema. No focal airspace disease, pleural effusion or
pneumothorax. Chronic change about the right shoulder.
IMPRESSION: Mild cardiomegaly without congestive failure or acute abnormality.

## 2018-08-01 IMAGING — CT CT ABD-PELV W/ CM
2 of 5 series · 16 of 46 positions shown, 18 images · IV contrast (Isovue)
Comparison: CT abdomen pelvis 06/23/2013

CLINICAL DATA: Abdominal pain

EXAM:
CT ABDOMEN AND PELVIS WITH CONTRAST
TECHNIQUE: Multidetector CT imaging of the abdomen and pelvis was performed
using the standard protocol following bolus administration of
intravenous contrast.
CONTRAST:  100mL 3N3H9V-F00 IOPAMIDOL (3N3H9V-F00) INJECTION 61%

[Series 2: axial st · axial · 0.91mm/px · z∈[+1048,+1453]mm · 13 of 93 slices shown, 15 images]
[im 6/93  soft-tissue]
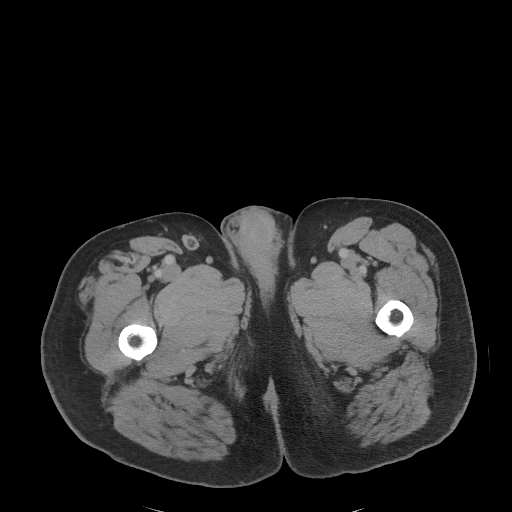
[im 6/93  bone]
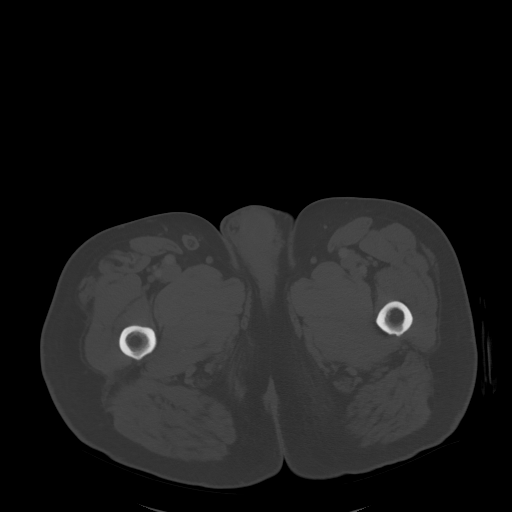
[im 11/93  soft-tissue]
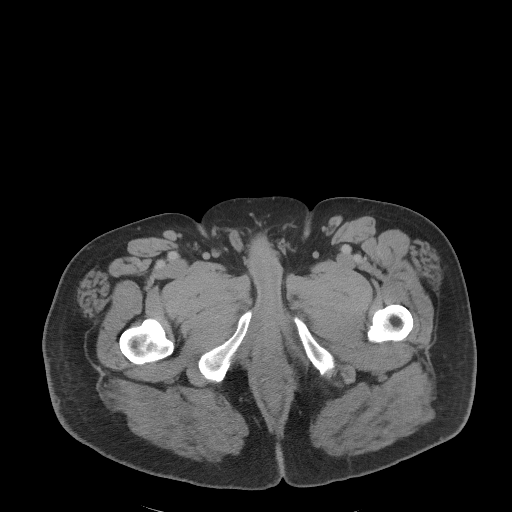
[im 22/93  soft-tissue]
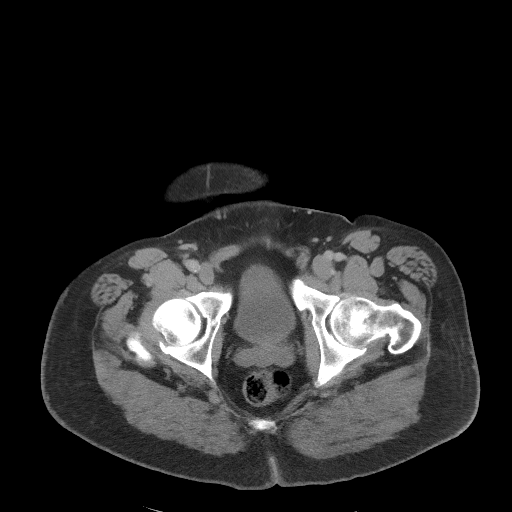
[im 28/93  soft-tissue]
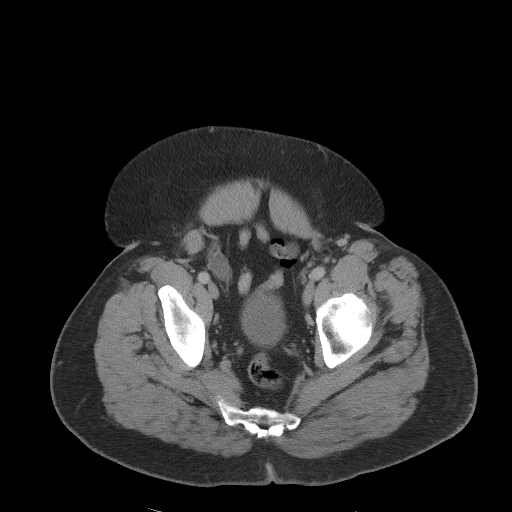
[im 33/93  soft-tissue]
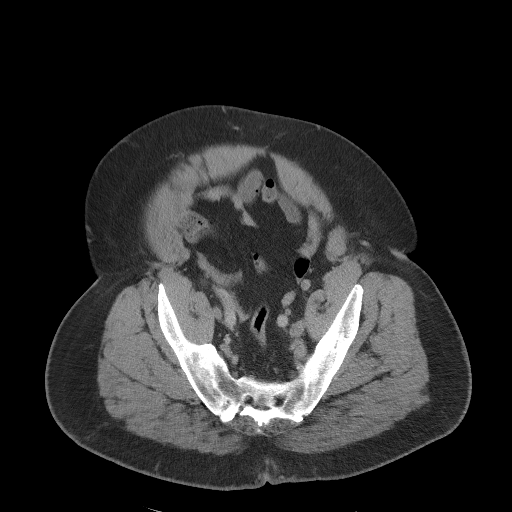
[im 38/93  soft-tissue]
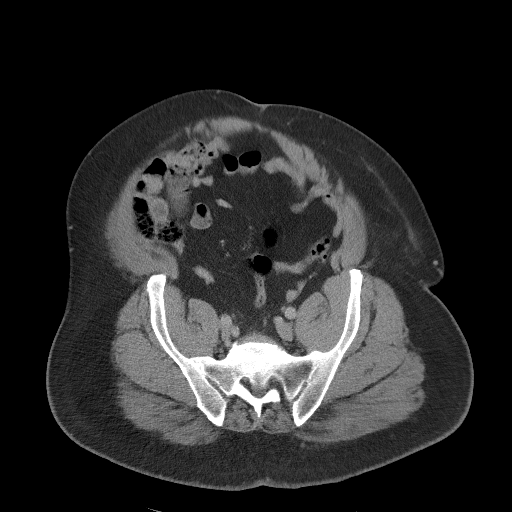
[im 49/93  soft-tissue]
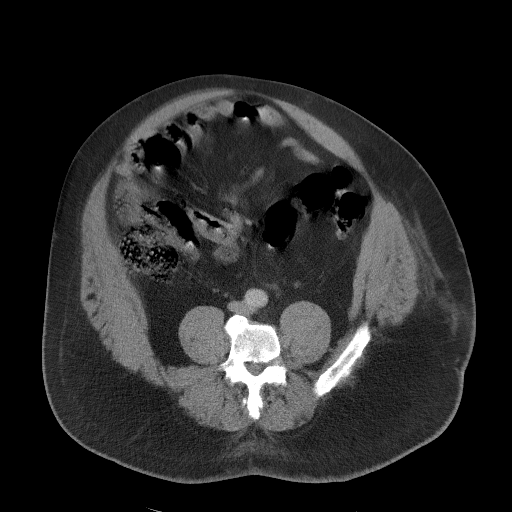
[im 55/93  soft-tissue]
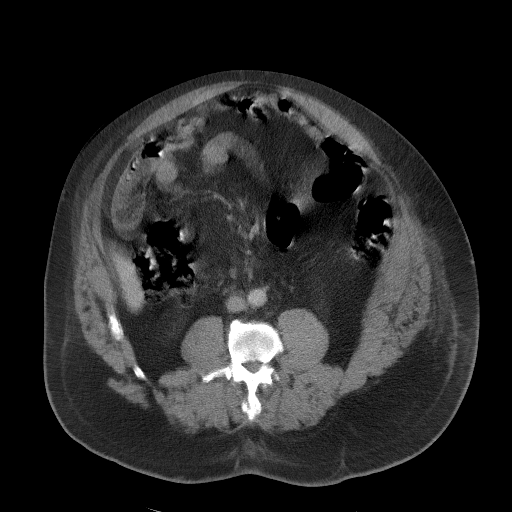
[im 60/93  soft-tissue]
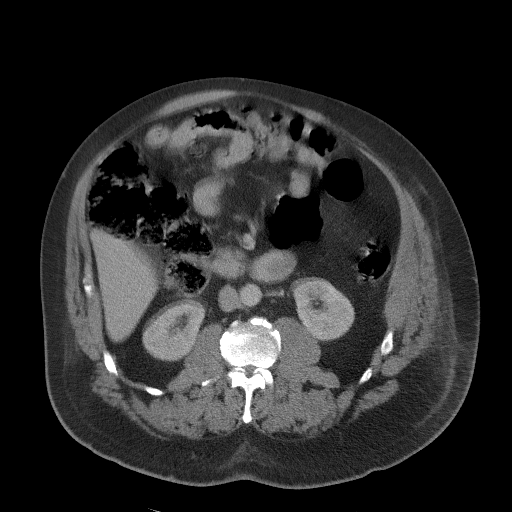
[im 60/93  bone]
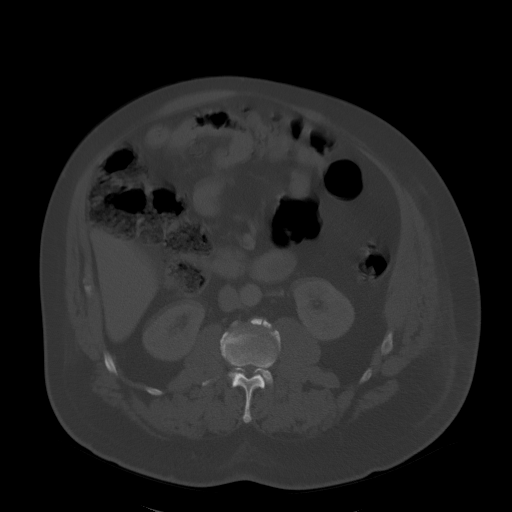
[im 65/93  soft-tissue]
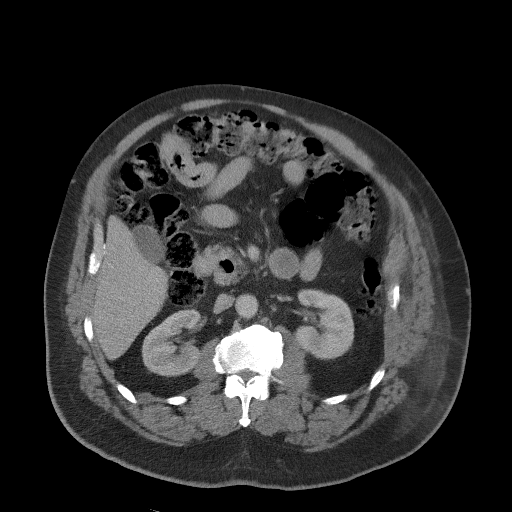
[im 71/93  soft-tissue]
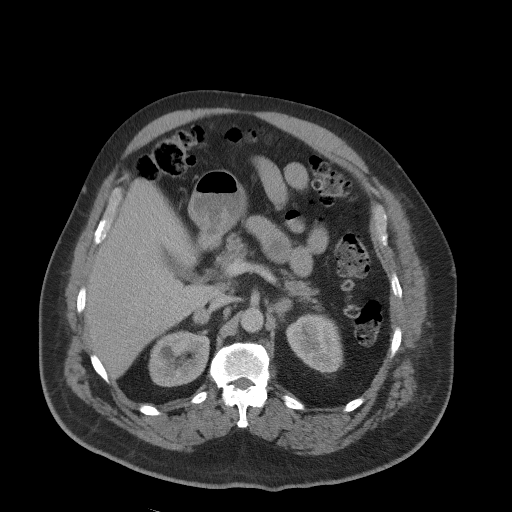
[im 82/93  soft-tissue]
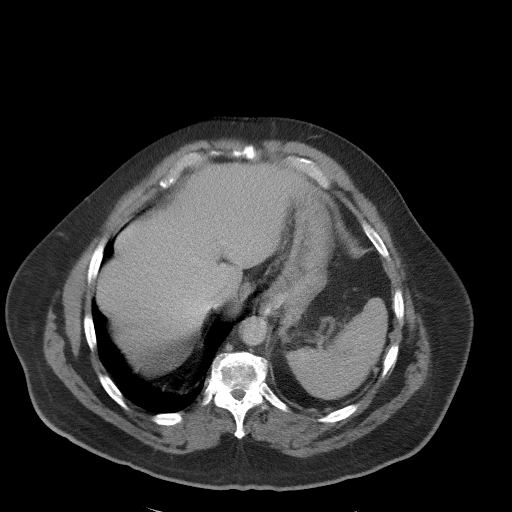
[im 87/93  soft-tissue]
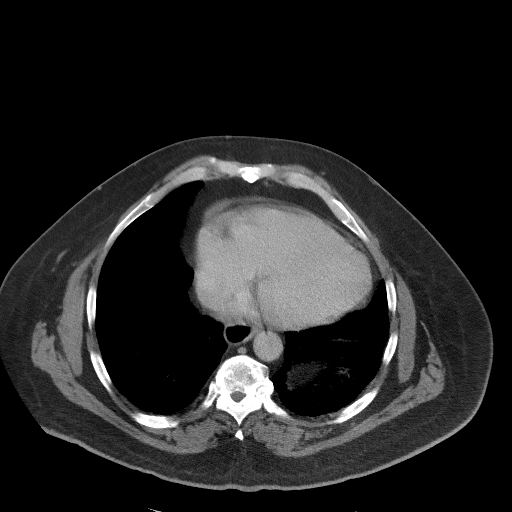

[Series 5: coronal st · coronal · 0.90mm/px · 3 of 132 slices shown]
[im 44/132  soft-tissue]
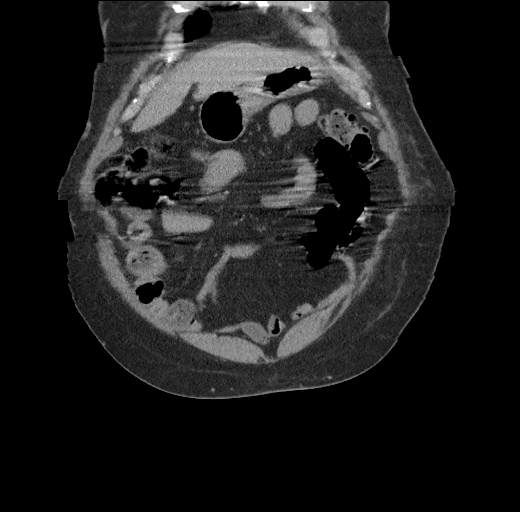
[im 59/132  soft-tissue]
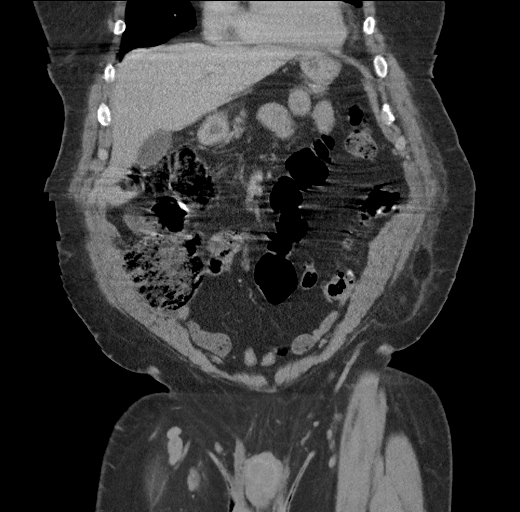
[im 73/132  soft-tissue]
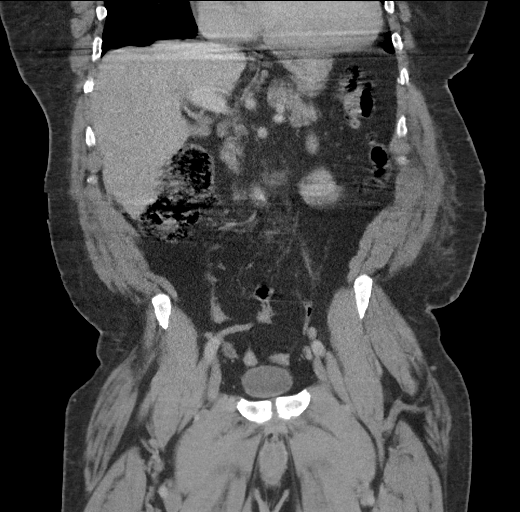

[16 of 46 positions shown; findings below may reference images not displayed]

FINDINGS: Lower chest: No basilar pulmonary nodules or pleural effusion. No
apical pericardial effusion.

Hepatobiliary: Normal hepatic contours and density. No intra- or
extrahepatic biliary dilatation. Normal gallbladder.

Pancreas: Normal parenchymal contours without ductal dilatation. No
peripancreatic fluid collection.

Spleen: Normal.

Adrenals/Urinary Tract:

--Adrenal glands: Normal.

--Right kidney/ureter: No hydronephrosis, perinephric stranding or
nephrolithiasis. No obstructing ureteral stones.

--Left kidney/ureter: No hydronephrosis, perinephric stranding or
nephrolithiasis. No obstructing ureteral stones.

--Urinary bladder: Normal appearance for the degree of distention.

Stomach/Bowel:

--Stomach/Duodenum: No hiatal hernia or other gastric abnormality.
Normal duodenal course.

--Small bowel: No dilatation or inflammation.

--Colon: There is distal transverse, descending and sigmoid colonic
diverticulosis without acute inflammation.

--Appendix: Not visualized. No right lower quadrant inflammation or
free fluid.

Vascular/Lymphatic: Normal course and caliber of the major abdominal
vessels. No abdominal or pelvic lymphadenopathy.

Reproductive: There are calcifications within the normal sized
prostate gland.

Musculoskeletal. Multilevel degenerative disc disease and facet
arthrosis. No bony spinal canal stenosis.

Other: There is edema of the subcutaneous fat of the lateral left
flank. No fluid collection.
IMPRESSION: 1. Edema of the subcutaneous fat of the lateral left flank
corresponds to reported area tenderness to palpation.
2. Distal transverse, descending and sigmoid colonic diverticulosis
without acute inflammation.
